# Patient Record
Sex: Male | Born: 1995 | Race: Black or African American | Hispanic: No | Smoking: Former smoker
Health system: Southern US, Community
[De-identification: ages and names within clinical notes are randomized; demographics above are authoritative.]

## PROBLEM LIST (undated history)

## (undated) DIAGNOSIS — S83512A Sprain of anterior cruciate ligament of left knee, initial encounter: Secondary | ICD-10-CM

## (undated) DIAGNOSIS — K810 Acute cholecystitis: Secondary | ICD-10-CM

---

## 2015-10-09 ENCOUNTER — Other Ambulatory Visit: Payer: Self-pay | Admitting: *Deleted

## 2015-10-09 ENCOUNTER — Other Ambulatory Visit: Payer: Self-pay | Admitting: Surgery

## 2015-10-09 ENCOUNTER — Encounter (HOSPITAL_COMMUNITY): Payer: Self-pay

## 2015-10-09 ENCOUNTER — Ambulatory Visit
Admission: RE | Admit: 2015-10-09 | Discharge: 2015-10-09 | Disposition: A | Discharge status: Home / Self Care | Payer: BLUE CROSS/BLUE SHIELD | Source: Ambulatory Visit | Attending: *Deleted | Admitting: *Deleted

## 2015-10-09 ENCOUNTER — Observation Stay (HOSPITAL_COMMUNITY)
Admission: AD | Admit: 2015-10-09 | Discharge: 2015-10-11 | Disposition: A | Discharge status: Home / Self Care | Payer: BLUE CROSS/BLUE SHIELD | Source: Ambulatory Visit | Attending: Surgery | Admitting: Surgery

## 2015-10-09 ENCOUNTER — Ambulatory Visit: Payer: Self-pay | Admitting: Surgery

## 2015-10-09 DIAGNOSIS — K8012 Calculus of gallbladder with acute and chronic cholecystitis without obstruction: Principal | ICD-10-CM | POA: Insufficient documentation

## 2015-10-09 DIAGNOSIS — K810 Acute cholecystitis: Secondary | ICD-10-CM

## 2015-10-09 DIAGNOSIS — Z87891 Personal history of nicotine dependence: Secondary | ICD-10-CM | POA: Diagnosis not present

## 2015-10-09 DIAGNOSIS — R1084 Generalized abdominal pain: Secondary | ICD-10-CM

## 2015-10-09 DIAGNOSIS — R110 Nausea: Secondary | ICD-10-CM

## 2015-10-09 DIAGNOSIS — Z419 Encounter for procedure for purposes other than remedying health state, unspecified: Secondary | ICD-10-CM

## 2015-10-09 DIAGNOSIS — K8000 Calculus of gallbladder with acute cholecystitis without obstruction: Secondary | ICD-10-CM | POA: Diagnosis present

## 2015-10-09 HISTORY — DX: Sprain of anterior cruciate ligament of left knee, initial encounter: S83.512A

## 2015-10-09 HISTORY — DX: Acute cholecystitis: K81.0

## 2015-10-09 LAB — COMPREHENSIVE METABOLIC PANEL
ALBUMIN: 4.4 g/dL (ref 3.5–5.0)
ALT: 12 U/L — ABNORMAL LOW (ref 17–63)
ANION GAP: 12 (ref 5–15)
AST: 25 U/L (ref 15–41)
Alkaline Phosphatase: 56 U/L (ref 38–126)
BUN: 5 mg/dL — ABNORMAL LOW (ref 6–20)
CHLORIDE: 99 mmol/L — AB (ref 101–111)
CO2: 28 mmol/L (ref 22–32)
Calcium: 9.7 mg/dL (ref 8.9–10.3)
Creatinine, Ser: 0.85 mg/dL (ref 0.61–1.24)
GFR calc non Af Amer: >60 mL/min (ref >60)
Glucose, Bld: 111 mg/dL — ABNORMAL HIGH (ref 65–99)
Potassium: 4 mmol/L (ref 3.5–5.1)
SODIUM: 139 mmol/L (ref 135–145)
Total Bilirubin: 0.5 mg/dL (ref 0.3–1.2)
Total Protein: 7.6 g/dL (ref 6.5–8.1)

## 2015-10-09 LAB — CBC
HCT: 39.4 % (ref 39.0–52.0)
Hemoglobin: 13.1 g/dL (ref 13.0–17.0)
MCH: 27.4 pg (ref 26.0–34.0)
MCHC: 33.2 g/dL (ref 30.0–36.0)
MCV: 82.4 fL (ref 78.0–100.0)
PLATELETS: 199 10*3/uL (ref 150–400)
RBC: 4.78 MIL/uL (ref 4.22–5.81)
RDW: 13.2 % (ref 11.5–15.5)
WBC: 19.3 10*3/uL — ABNORMAL HIGH (ref 4.0–10.5)

## 2015-10-09 MED ORDER — DEXTROSE 5 % IV SOLN
2.0000 g | INTRAVENOUS | Status: DC
  Administered 2015-10-09 – 2015-10-10 (×2): 2 g via INTRAVENOUS
  Filled 2015-10-09 (×3): qty 2

## 2015-10-09 MED ORDER — ONDANSETRON 4 MG PO TBDP: 4.0000 mg | ORAL_TABLET | Freq: Four times a day (QID) | ORAL | Status: DC | PRN

## 2015-10-09 MED ORDER — KCL IN DEXTROSE-NACL 20-5-0.45 MEQ/L-%-% IV SOLN: INTRAVENOUS | Status: AC

## 2015-10-09 MED ORDER — IOPAMIDOL (ISOVUE-300) INJECTION 61%: 100.0000 mL | Freq: Once | INTRAVENOUS | Status: DC | PRN

## 2015-10-09 MED ORDER — ACETAMINOPHEN 500 MG PO TABS
1000.0000 mg | ORAL_TABLET | Freq: Four times a day (QID) | ORAL | Status: DC
  Administered 2015-10-09 – 2015-10-11 (×6): 1000 mg via ORAL
  Filled 2015-10-09 (×14): qty 2

## 2015-10-09 MED ORDER — ACETAMINOPHEN 325 MG PO TABS: 650.0000 mg | ORAL_TABLET | Freq: Four times a day (QID) | ORAL | Status: AC | PRN

## 2015-10-09 MED ORDER — ONDANSETRON HCL 4 MG/2ML IJ SOLN
4.0000 mg | Freq: Four times a day (QID) | INTRAMUSCULAR | Status: DC | PRN
Start: 2015-10-09 — End: 2015-10-10

## 2015-10-09 MED ORDER — HYDROMORPHONE HCL 1 MG/ML IJ SOLN
1.0000 mg | INTRAMUSCULAR | Status: DC | PRN
  Administered 2015-10-09 – 2015-10-10 (×5): 1 mg via INTRAVENOUS
  Filled 2015-10-09 (×6): qty 1

## 2015-10-09 MED ORDER — HYDROCODONE-ACETAMINOPHEN 5-325 MG PO TABS: 1.0000 | ORAL_TABLET | ORAL | Status: AC | PRN

## 2015-10-09 MED ORDER — ONDANSETRON HCL 40 MG/20ML IJ SOLN: 4.0000 mg | Freq: Four times a day (QID) | INTRAMUSCULAR | Status: AC | PRN

## 2015-10-09 MED ORDER — ACETAMINOPHEN 650 MG RE SUPP: 650.0000 mg | Freq: Four times a day (QID) | RECTAL | Status: AC | PRN

## 2015-10-09 MED ORDER — DEXTROSE 5 % IV SOLN: 2.0000 g | INTRAVENOUS | Status: AC

## 2015-10-09 MED ORDER — ONDANSETRON 4 MG PO TBDP: 4.0000 mg | ORAL_TABLET | Freq: Four times a day (QID) | ORAL | Status: AC | PRN

## 2015-10-09 MED ORDER — HYDROCODONE-ACETAMINOPHEN 5-325 MG PO TABS: 1.0000 | ORAL_TABLET | ORAL | Status: DC | PRN

## 2015-10-09 MED ORDER — ENOXAPARIN SODIUM 40 MG/0.4ML ~~LOC~~ SOLN
40.0000 mg | SUBCUTANEOUS | Status: DC
  Administered 2015-10-09 – 2015-10-10 (×2): 40 mg via SUBCUTANEOUS
  Filled 2015-10-09 (×3): qty 0.4

## 2015-10-09 MED ORDER — HYDROMORPHONE HCL 2 MG/ML IJ SOLN: 1.0000 mg | INTRAMUSCULAR | Status: AC | PRN

## 2015-10-09 MED ORDER — DEXTROSE-NACL 5-0.9 % IV SOLN
INTRAVENOUS | Status: DC
  Administered 2015-10-09: 14:00:00 via INTRAVENOUS

## 2015-10-09 NOTE — H&P (Signed)
[facility] Admission Note  William Mooney 01/31/1996  [id number].    Requesting MD: Dr. Marchia Bond Chief Complaint/Reason for Consult: Acute cholecystitis  HPI:  20 y/o healthy AA male who was in his usual state of health until he was awoken from sleep early this am with acute severe 8/10 sharp epigastric abdominal pain with nausea.  He has never had anything like this before.  Denies being related to food, although he had broccoli cheese chicken stir-fry last night.  Denies precipating, alleviating or radicular factors.  Only medical hx are ACL/meniscal issues in his left knee.  No H/o hernias or abdominal surgeries.  Denies CP/SOB, fever/chills, vomiting, diarrhea, dysuria. No sick contacts or recent travel.  He lives at home with his wife.    Today he went to his mom's work at [employer] where they have a doctor on staff there.  They suspected cholecystitis so they sent him for a CT scan which showed acute cholecystitis.  Lab work pending.    ROS: All systems reviewed and otherwise negative except for as above  History reviewed. No pertinent family history.  History reviewed. No pertinent past medical history.  History reviewed. No pertinent past surgical history.  Social History:  reports that he has quit smoking. His smoking use included Cigarettes. He has never used smokeless tobacco. He reports that he drinks about 1.2 oz of alcohol per week. He reports that he uses illicit drugs (Marijuana).  Allergies: Allergies not on file  No prescriptions prior to admission    Blood pressure 131/70, pulse 57, temperature 97.8 F (36.6 C), resp. rate 20, SpO2 100 %. Physical Exam: General: pleasant, WD/WN AA male who is laying in bed in NAD HEENT: head is normocephalic, atraumatic.  Sclera are noninjected.  PERRL.  Ears and nose without any masses or lesions.  Mouth is pink and moist Heart: regular, rate, and rhythm.  No obvious murmurs, gallops, or rubs noted.   Palpable pedal pulses bilaterally Lungs: CTAB, no wheezes, rhonchi, or rales noted.  Respiratory effort nonlabored Abd: soft, ND, tender in the upper abdomen and >epigastrium, +BS, no masses, hernias, or organomegaly, no scars MS: all 4 extremities are symmetrical with no cyanosis, clubbing, or edema. Skin: warm and dry with no masses, lesions, or rashes Psych: A&Ox3 with an appropriate affect.   No results found for this or any previous visit (from the past 48 hour(s)). Ct Abdomen W Contrast  10/09/2015  CLINICAL DATA:  Mid upper and epigastric pain with nausea. Acute onset this morning. EXAM: CT ABDOMEN WITH CONTRAST TECHNIQUE: Multidetector CT imaging of the abdomen was performed using the standard protocol following bolus administration of intravenous contrast. CONTRAST:  100 cc Isovue 300. COMPARISON:  None. FINDINGS: Lower chest: Lung bases show no acute findings. Heart size normal. No pericardial or pleural effusion. Hepatobiliary: A 1.8 cm low-attenuation lesion appears to arise from the right hepatic lobe, superior to the gallbladder fossa (series 2, image 27), likely representing a cyst. There is hepatic parenchymal hyper attenuation along the gallbladder fossa. Gallbladder wall appears edematous and there is at least 1 gallstone. Measurement is difficult as it is not well calcified. No biliary ductal dilatation. Pancreas: Negative. Spleen: Negative. Adrenals/Urinary Tract: Adrenal glands and kidneys are unremarkable. Visualized portions of the ureters are decompressed. Stomach/Bowel: Stomach and visualized portions of the small bowel and colon are unremarkable. Vascular/Lymphatic: Vascular structures are unremarkable. No pathologically enlarged lymph nodes. Other: Mesenteries and peritoneum are unremarkable.  No free fluid. Musculoskeletal: No  worrisome lytic or sclerotic lesions. IMPRESSION: 1. 2. Cholelithiasis with gallbladder wall edema and apparent hyperemia in the surrounding hepatic  parenchyma. Findings are suggestive of acute cholecystitis. Right upper quadrant ultrasound may be helpful in further evaluation, as clinically indicated. 3. Probable small hepatic cyst along the superior margin of the gallbladder fossa. Electronically Signed   By: Leanna Battles M.D.   On: 10/09/2015 10:33     Assessment/Plan Acute cholecystitis -Admit to [facility], plan for IV antibiotics (Rocephin) and OR tomorrow for lap chole with possible IOC with Dr. Gerrit Friends -NPO, bowel rest, IVF, pain control, antiemetics -SCD's, lovenox @ 1800, but hold pre-op -Ambulate and IS -Pending labs -Discussed surgical risks/benefits and post-op recovery   Nonie Hoyer, [facility] 10/09/2015, 1:33 PM Pager: [phone]

## 2015-10-10 ENCOUNTER — Encounter (HOSPITAL_COMMUNITY): Admission: AD | Disposition: A | Discharge status: Home / Self Care | Payer: Self-pay | Source: Ambulatory Visit

## 2015-10-10 ENCOUNTER — Observation Stay (HOSPITAL_COMMUNITY): Payer: BLUE CROSS/BLUE SHIELD

## 2015-10-10 ENCOUNTER — Encounter (HOSPITAL_COMMUNITY): Payer: Self-pay

## 2015-10-10 ENCOUNTER — Observation Stay (HOSPITAL_COMMUNITY): Payer: BLUE CROSS/BLUE SHIELD | Admitting: Anesthesiology

## 2015-10-10 DIAGNOSIS — K8012 Calculus of gallbladder with acute and chronic cholecystitis without obstruction: Secondary | ICD-10-CM | POA: Diagnosis not present

## 2015-10-10 HISTORY — PX: CHOLECYSTECTOMY: SHX55

## 2015-10-10 LAB — SURGICAL PCR SCREEN
MRSA, PCR: NEGATIVE
Staphylococcus aureus: NEGATIVE

## 2015-10-10 SURGERY — LAPAROSCOPIC CHOLECYSTECTOMY WITH INTRAOPERATIVE CHOLANGIOGRAM
Anesthesia: General

## 2015-10-10 MED ORDER — HYDROMORPHONE HCL 1 MG/ML IJ SOLN
0.2500 mg | INTRAMUSCULAR | Status: DC | PRN
  Administered 2015-10-10 (×2): 0.5 mg via INTRAVENOUS

## 2015-10-10 MED ORDER — FENTANYL CITRATE (PF) 250 MCG/5ML IJ SOLN
INTRAMUSCULAR | Status: AC
  Filled 2015-10-10: qty 5

## 2015-10-10 MED ORDER — SUGAMMADEX SODIUM 200 MG/2ML IV SOLN
INTRAVENOUS | Status: DC | PRN
  Administered 2015-10-10: 200 mg via INTRAVENOUS

## 2015-10-10 MED ORDER — DEXTROSE 5 % IV SOLN
INTRAVENOUS | Status: AC
  Filled 2015-10-10: qty 2

## 2015-10-10 MED ORDER — LACTATED RINGERS IV SOLN
INTRAVENOUS | Status: DC | PRN
  Administered 2015-10-10 (×2): via INTRAVENOUS

## 2015-10-10 MED ORDER — BUPIVACAINE-EPINEPHRINE (PF) 0.25% -1:200000 IJ SOLN
INTRAMUSCULAR | Status: AC
  Filled 2015-10-10: qty 30

## 2015-10-10 MED ORDER — ROCURONIUM BROMIDE 100 MG/10ML IV SOLN
INTRAVENOUS | Status: AC
  Filled 2015-10-10: qty 1

## 2015-10-10 MED ORDER — LACTATED RINGERS IV SOLN: INTRAVENOUS | Status: DC

## 2015-10-10 MED ORDER — FENTANYL CITRATE (PF) 100 MCG/2ML IJ SOLN
INTRAMUSCULAR | Status: DC | PRN
  Administered 2015-10-10: 125 ug via INTRAVENOUS

## 2015-10-10 MED ORDER — ACETAMINOPHEN 650 MG RE SUPP: 650.0000 mg | Freq: Four times a day (QID) | RECTAL | Status: DC | PRN

## 2015-10-10 MED ORDER — LIDOCAINE HCL (CARDIAC) 20 MG/ML IV SOLN
INTRAVENOUS | Status: DC | PRN
  Administered 2015-10-10: 100 mg via INTRAVENOUS

## 2015-10-10 MED ORDER — EPHEDRINE SULFATE 50 MG/ML IJ SOLN
INTRAMUSCULAR | Status: DC | PRN
  Administered 2015-10-10: 5 mg via INTRAVENOUS

## 2015-10-10 MED ORDER — HYDROCODONE-ACETAMINOPHEN 5-325 MG PO TABS: 1.0000 | ORAL_TABLET | ORAL | Status: DC | PRN

## 2015-10-10 MED ORDER — HYDROMORPHONE HCL 1 MG/ML IJ SOLN
INTRAMUSCULAR | Status: AC
Start: 2015-10-10 — End: 2015-10-11
  Filled 2015-10-10: qty 1

## 2015-10-10 MED ORDER — LIDOCAINE HCL (CARDIAC) 20 MG/ML IV SOLN
INTRAVENOUS | Status: AC
  Filled 2015-10-10: qty 5

## 2015-10-10 MED ORDER — ONDANSETRON 4 MG PO TBDP: 4.0000 mg | ORAL_TABLET | Freq: Four times a day (QID) | ORAL | Status: DC | PRN

## 2015-10-10 MED ORDER — PROPOFOL 10 MG/ML IV BOLUS
INTRAVENOUS | Status: AC
  Filled 2015-10-10: qty 20

## 2015-10-10 MED ORDER — ROCURONIUM BROMIDE 100 MG/10ML IV SOLN
INTRAVENOUS | Status: DC | PRN
  Administered 2015-10-10: 10 mg via INTRAVENOUS
  Administered 2015-10-10: 50 mg via INTRAVENOUS

## 2015-10-10 MED ORDER — HYDROMORPHONE HCL 1 MG/ML IJ SOLN
0.5000 mg | INTRAMUSCULAR | Status: DC | PRN
  Administered 2015-10-10 – 2015-10-11 (×3): 1 mg via INTRAVENOUS
  Filled 2015-10-10 (×3): qty 1

## 2015-10-10 MED ORDER — DEXTROSE 5 % IV SOLN: 2.0000 g | INTRAVENOUS | Status: DC

## 2015-10-10 MED ORDER — HYDROMORPHONE HCL 1 MG/ML IJ SOLN
INTRAMUSCULAR | Status: AC
  Filled 2015-10-10: qty 1

## 2015-10-10 MED ORDER — BUPIVACAINE-EPINEPHRINE 0.5% -1:200000 IJ SOLN
INTRAMUSCULAR | Status: DC | PRN
  Administered 2015-10-10: 30 mL

## 2015-10-10 MED ORDER — LACTATED RINGERS IV SOLN
INTRAVENOUS | Status: DC
  Administered 2015-10-10: 1000 mL via INTRAVENOUS

## 2015-10-10 MED ORDER — KCL IN DEXTROSE-NACL 20-5-0.45 MEQ/L-%-% IV SOLN
INTRAVENOUS | Status: DC
  Administered 2015-10-10: 17:00:00 via INTRAVENOUS
  Filled 2015-10-10 (×3): qty 1000

## 2015-10-10 MED ORDER — PROPOFOL 10 MG/ML IV BOLUS
INTRAVENOUS | Status: DC | PRN
  Administered 2015-10-10: 200 mg via INTRAVENOUS

## 2015-10-10 MED ORDER — HEMOSTATIC AGENTS (NO CHARGE) OPTIME
TOPICAL | Status: DC | PRN
  Administered 2015-10-10: 1 via TOPICAL

## 2015-10-10 MED ORDER — SUGAMMADEX SODIUM 200 MG/2ML IV SOLN
INTRAVENOUS | Status: AC
  Filled 2015-10-10: qty 2

## 2015-10-10 MED ORDER — 0.9 % SODIUM CHLORIDE (POUR BTL) OPTIME
TOPICAL | Status: DC | PRN
  Administered 2015-10-10: 1000 mL

## 2015-10-10 MED ORDER — ONDANSETRON HCL 4 MG/2ML IJ SOLN
INTRAMUSCULAR | Status: DC | PRN
  Administered 2015-10-10: 4 mg via INTRAVENOUS

## 2015-10-10 MED ORDER — LACTATED RINGERS IR SOLN
Status: DC | PRN
Start: 2015-10-10 — End: 2015-10-10
  Administered 2015-10-10: 1000 mL

## 2015-10-10 MED ORDER — MIDAZOLAM HCL 2 MG/2ML IJ SOLN
INTRAMUSCULAR | Status: AC
  Filled 2015-10-10: qty 2

## 2015-10-10 MED ORDER — MIDAZOLAM HCL 5 MG/5ML IJ SOLN
INTRAMUSCULAR | Status: DC | PRN
  Administered 2015-10-10: 2 mg via INTRAVENOUS

## 2015-10-10 MED ORDER — IOHEXOL 300 MG/ML  SOLN
INTRAMUSCULAR | Status: DC | PRN
  Administered 2015-10-10: 7.5 mL

## 2015-10-10 MED ORDER — ACETAMINOPHEN 325 MG PO TABS: 650.0000 mg | ORAL_TABLET | Freq: Four times a day (QID) | ORAL | Status: DC | PRN

## 2015-10-10 MED ORDER — ONDANSETRON HCL 4 MG/2ML IJ SOLN: 4.0000 mg | Freq: Four times a day (QID) | INTRAMUSCULAR | Status: DC | PRN

## 2015-10-10 MED ORDER — ONDANSETRON HCL 4 MG/2ML IJ SOLN
INTRAMUSCULAR | Status: AC
  Filled 2015-10-10: qty 2

## 2015-10-10 SURGICAL SUPPLY — 46 items
APL SKNCLS STERI-STRIP NONHPOA (GAUZE/BANDAGES/DRESSINGS) ×1
APL SRG 38 LTWT LNG FL B (MISCELLANEOUS) ×1
APPLICATOR ARISTA FLEXITIP XL (MISCELLANEOUS) ×2 IMPLANT
APPLIER CLIP ROT 10 11.4 M/L (STAPLE) ×3
APR CLP MED LRG 11.4X10 (STAPLE) ×1
BAG SPEC RTRVL LRG 6X4 10 (ENDOMECHANICALS) ×1
BENZOIN TINCTURE PRP APPL 2/3 (GAUZE/BANDAGES/DRESSINGS) ×3 IMPLANT
CABLE HIGH FREQUENCY MONO STRZ (ELECTRODE) ×3 IMPLANT
CHLORAPREP W/TINT 26ML (MISCELLANEOUS) ×3 IMPLANT
CLIP APPLIE ROT 10 11.4 M/L (STAPLE) ×1 IMPLANT
CLOSURE STERI-STRIP 1/4X4 (GAUZE/BANDAGES/DRESSINGS) ×2 IMPLANT
COVER MAYO STAND STRL (DRAPES) ×3 IMPLANT
COVER SURGICAL LIGHT HANDLE (MISCELLANEOUS) ×3 IMPLANT
DECANTER SPIKE VIAL GLASS SM (MISCELLANEOUS) ×3 IMPLANT
DRAPE C-ARM 42X120 X-RAY (DRAPES) ×3 IMPLANT
DRAPE LAPAROSCOPIC ABDOMINAL (DRAPES) ×1 IMPLANT
DRSG TEGADERM 2-3/8X2-3/4 SM (GAUZE/BANDAGES/DRESSINGS) ×2 IMPLANT
ELECT REM PT RETURN 9FT ADLT (ELECTROSURGICAL) ×3
ELECTRODE REM PT RTRN 9FT ADLT (ELECTROSURGICAL) ×1 IMPLANT
GAUZE SPONGE 2X2 8PLY STRL LF (GAUZE/BANDAGES/DRESSINGS) ×1 IMPLANT
GLOVE BIO SURGEON STRL SZ 6.5 (GLOVE) ×1 IMPLANT
GLOVE BIO SURGEONS STRL SZ 6.5 (GLOVE) ×1
GLOVE BIOGEL PI IND STRL 7.5 (GLOVE) IMPLANT
GLOVE BIOGEL PI INDICATOR 7.5 (GLOVE) ×2
GLOVE INDICATOR 6.5 STRL GRN (GLOVE) ×2 IMPLANT
GLOVE SURG ORTHO 8.0 STRL STRW (GLOVE) ×3 IMPLANT
GLOVE SURG SS PI 7.5 STRL IVOR (GLOVE) ×2 IMPLANT
GOWN STRL REUS W/ TWL LRG LVL3 (GOWN DISPOSABLE) IMPLANT
GOWN STRL REUS W/TWL LRG LVL3 (GOWN DISPOSABLE) ×3
GOWN STRL REUS W/TWL XL LVL3 (GOWN DISPOSABLE) ×6 IMPLANT
HEMOSTAT ARISTA ABSORB 3G PWDR (MISCELLANEOUS) ×2 IMPLANT
KIT BASIN OR (CUSTOM PROCEDURE TRAY) ×3 IMPLANT
POUCH SPECIMEN RETRIEVAL 10MM (ENDOMECHANICALS) ×3 IMPLANT
SCISSORS LAP 5X35 DISP (ENDOMECHANICALS) ×3 IMPLANT
SET CHOLANGIOGRAPH MIX (MISCELLANEOUS) ×5 IMPLANT
SET IRRIG TUBING LAPAROSCOPIC (IRRIGATION / IRRIGATOR) ×3 IMPLANT
SLEEVE XCEL OPT CAN 5 100 (ENDOMECHANICALS) ×3 IMPLANT
SPONGE GAUZE 2X2 STER 10/PKG (GAUZE/BANDAGES/DRESSINGS) ×2
SUT MNCRL AB 4-0 PS2 18 (SUTURE) ×3 IMPLANT
SUT VICRYL 0 UR6 27IN ABS (SUTURE) ×2 IMPLANT
TAPE CLOTH SURG 4X10 WHT LF (GAUZE/BANDAGES/DRESSINGS) ×2 IMPLANT
TOWEL OR 17X26 10 PK STRL BLUE (TOWEL DISPOSABLE) ×3 IMPLANT
TRAY LAPAROSCOPIC (CUSTOM PROCEDURE TRAY) ×3 IMPLANT
TROCAR BLADELESS OPT 5 100 (ENDOMECHANICALS) ×3 IMPLANT
TROCAR XCEL BLUNT TIP 100MML (ENDOMECHANICALS) ×3 IMPLANT
TROCAR XCEL NON-BLD 11X100MML (ENDOMECHANICALS) ×3 IMPLANT

## 2015-10-10 NOTE — Transfer of Care (Signed)
Immediate Anesthesia Transfer of Care Note  Patient: William Mooney  Procedure(s) Performed: Procedure(s): LAPAROSCOPIC CHOLECYSTECTOMY WITH INTRAOPERATIVE CHOLANGIOGRAM (N/A)  Patient Location: PACU  Anesthesia Type:General  Level of Consciousness: awake, alert  and oriented  Airway & Oxygen Therapy: Patient Spontanous Breathing and Patient connected to face mask oxygen  Post-op Assessment: Report given to RN and Post -op Vital signs reviewed and stable  Post vital signs: Reviewed and stable  Last Vitals:  Filed Vitals:   10/10/15 0112 10/10/15 0439  BP: 116/50 95/44  Pulse: 53 50  Temp: 36.9 C 36.9 C  Resp: 18 16    Complications: No apparent anesthesia complications

## 2015-10-10 NOTE — Anesthesia Postprocedure Evaluation (Signed)
Anesthesia Post Note  Patient: William Mooney  Procedure(s) Performed: Procedure(s) (LRB): LAPAROSCOPIC CHOLECYSTECTOMY WITH INTRAOPERATIVE CHOLANGIOGRAM (N/A)  Patient location during evaluation: PACU Anesthesia Type: General Level of consciousness: awake and alert Pain management: pain level controlled Vital Signs Assessment: post-procedure vital signs reviewed and stable Respiratory status: spontaneous breathing, nonlabored ventilation, respiratory function stable and patient connected to nasal cannula oxygen Cardiovascular status: blood pressure returned to baseline and stable Postop Assessment: no signs of nausea or vomiting Anesthetic complications: no    Last Vitals:  Filed Vitals:   10/10/15 1600 10/10/15 1613  BP: 121/58 122/53  Pulse: 65 60  Temp: 36.9 C 37.1 C  Resp: 16 16    Last Pain:  Filed Vitals:   10/10/15 1747  PainSc: 0-No pain                 Shaney Deckman L

## 2015-10-10 NOTE — Anesthesia Preprocedure Evaluation (Addendum)
Anesthesia Evaluation  Patient identified by MRN, date of birth, ID band Patient awake    Reviewed: Allergy & Precautions, H&P , NPO status , Patient's Chart, lab work & pertinent test results  Airway Mallampati: II  TM Distance: >3 FB Neck ROM: full    Dental no notable dental hx. (+) Dental Advisory Given, Teeth Intact   Pulmonary neg pulmonary ROS, former smoker,    Pulmonary exam normal breath sounds clear to auscultation       Cardiovascular Exercise Tolerance: Good negative cardio ROS Normal cardiovascular exam Rhythm:regular Rate:Normal     Neuro/Psych negative neurological ROS  negative psych ROS   GI/Hepatic negative GI ROS, Neg liver ROS,   Endo/Other  negative endocrine ROS  Renal/GU negative Renal ROS  negative genitourinary   Musculoskeletal   Abdominal   Peds  Hematology negative hematology ROS (+)   Anesthesia Other Findings   Reproductive/Obstetrics negative OB ROS                            Anesthesia Physical Anesthesia Plan  ASA: I  Anesthesia Plan: General   Post-op Pain Management:    Induction: Intravenous  Airway Management Planned: Oral ETT  Additional Equipment:   Intra-op Plan:   Post-operative Plan: Extubation in OR  Informed Consent: I have reviewed the patients History and Physical, chart, labs and discussed the procedure including the risks, benefits and alternatives for the proposed anesthesia with the patient or authorized representative who has indicated his/her understanding and acceptance.   Dental Advisory Given  Plan Discussed with: CRNA and Surgeon  Anesthesia Plan Comments:        Anesthesia Quick Evaluation  

## 2015-10-10 NOTE — Anesthesia Procedure Notes (Signed)
Procedure Name: Intubation Date/Time: 10/10/2015 1:16 PM Performed by: Thornell Mule Pre-anesthesia Checklist: Patient identified, Emergency Drugs available, Suction available and Patient being monitored Patient Re-evaluated:Patient Re-evaluated prior to inductionOxygen Delivery Method: Circle System Utilized Preoxygenation: Pre-oxygenation with 100% oxygen Intubation Type: IV induction Ventilation: Mask ventilation without difficulty Laryngoscope Size: Miller and 3 Grade View: Grade I Tube type: Oral Tube size: 7.5 mm Number of attempts: 1 Airway Equipment and Method: Stylet and Oral airway Placement Confirmation: ETT inserted through vocal cords under direct vision,  positive ETCO2 and breath sounds checked- equal and bilateral Secured at: 22 cm Tube secured with: Tape Dental Injury: Teeth and Oropharynx as per pre-operative assessment

## 2015-10-10 NOTE — Op Note (Signed)
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  For cholecystectomy.  Procedure Details:  The patient was seen in the pre-op holding area. The risks, benefits, complications, treatment options, and expected outcomes were previously discussed with the patient. The patient agreed with the proposed plan and has signed the informed consent form.  The patient was brought to the Operating Room, identified as William Mooney and the procedure verified as laparoscopic cholecystectomy with intraoperative cholangiography. A "time out" was completed and the above information confirmed.  The patient was placed in the supine position. Following induction of general anesthesia, the abdomen was prepped and draped in the usual aseptic fashion.  An incision was made in the skin near the umbilicus. The midline fascia was incised and the peritoneal cavity was entered and a Hasson canula was introduced under direct vision.  The Hasson canula was secured with a 0-Vicryl pursestring suture. Pneumoperitoneum was established with carbon dioxide. Additional trocars were introduced under direct vision along the right costal margin in the midline, mid-clavicular line, and anterior axillary line.   The gallbladder was identified.  It was acutely inflamed, thick-walled, and edematous.  The gallbladder was aspirated with a trocar and the fundus grasped and retracted cephalad. Adhesions were taken down bluntly and the electrocautery was utilized as needed, taking care not to injure any  adjacent structures. The infundibulum was grasped and retracted laterally, exposing the peritoneum overlying the triangle of Calot. The peritoneum was incised and structures exposed with blunt dissection. The cystic duct was clearly identified, bluntly dissected circumferentially, and clipped at the neck of the gallbladder.  An incision was made in the cystic duct and the cholangiogram catheter introduced. The catheter was secured using an ligaclip.  Real-time cholangiography was performed using C-arm fluoroscopy.  There was rapid filling of a normal caliber common bile duct.  There was reflux of contrast into the common hepatic duct.  There was a proximal choledochal cyst at the bifurcation.  Contrast entered the left and right hepatic ductal systems.  There was free flow distally into the duodenum without filling defect or obstruction.  The catheter was removed from the peritoneal cavity.  The cystic duct was then ligated with surgical clips and divided. The cystic artery was identified, dissected circumferentially, ligated with ligaclips, and divided.  The gallbladder was dissected away from the liver bed using the electrocautery for hemostasis. The gallbladder was completely removed from the liver and placed into an endocatch bag. The gallbladder was removed in the endocatch bag through the umbilical port site and submitted to pathology for review.  The right upper quadrant was irrigated and the gallbladder bed was inspected. Hemostasis was achieved with the electrocautery.  Arista was applied to the gallbladder bed.  Pneumoperitoneum was released after viewing removal of the trocars with good hemostasis noted. The umbilical wound was irrigated and the fascia was then closed with the pursestring suture.  Local anesthetic was infiltrated at all port sites. The skin incisions were closed with 4-0 Monocril subcuticular sutures and steri-strips and dressings were applied.  Instrument, sponge, and needle  counts  were correct at the conclusion of the case.  The patient was awakened from anesthesia and brought to the recovery room in stable condition.  The patient tolerated the procedure well.   Velora Heckler, MD, [facility] Office: [phone]

## 2015-10-11 DIAGNOSIS — K8012 Calculus of gallbladder with acute and chronic cholecystitis without obstruction: Secondary | ICD-10-CM | POA: Diagnosis not present

## 2015-10-11 MED ORDER — ACETAMINOPHEN 500 MG PO TABS: 1000.0000 mg | ORAL_TABLET | Freq: Four times a day (QID) | ORAL | Status: AC

## 2015-10-11 MED ORDER — IBUPROFEN 600 MG PO TABS: 600.0000 mg | ORAL_TABLET | Freq: Four times a day (QID) | ORAL | Status: AC | PRN

## 2015-10-11 MED ORDER — HYDROCODONE-ACETAMINOPHEN 5-325 MG PO TABS: 1.0000 | ORAL_TABLET | ORAL | Status: AC | PRN

## 2015-10-11 NOTE — Progress Notes (Signed)
Pt encouraged 2x this AM to ambulate in the hall and order breakfast to see if can tolerate.  Pt not completed at this time despite education on importance.

## 2015-10-11 NOTE — Discharge Instructions (Signed)

## 2015-10-11 NOTE — Progress Notes (Signed)
Pt discharged home.  IV d/c with cath intact. VSS.  Prescriptions and discharge instructions provided. Pt verbalized understanding.

## 2015-10-11 NOTE — Discharge Summary (Signed)
Physician Discharge Summary  Patient ID: William Mooney MRN: [id number] DOB/AGE: 03-02-1996 20 y.o.  Admit date: 10/09/2015 Discharge date: 10/11/2015  Admitting Diagnosis: Acute cholecystitis  Discharge Diagnosis Patient Active Problem List   Diagnosis Date Noted  . Cholecystitis, acute 10/09/2015  . Acute cholecystitis 10/09/2015  choledochal cyst  Consultants none  Imaging: Dg Cholangiogram Operative  10/10/2015  CLINICAL DATA:  Cholecystectomy. Choledochal cyst at the bifurcation of the right and left hepatic ducts. EXAM: INTRAOPERATIVE CHOLANGIOGRAM TECHNIQUE: Cholangiographic images from the C-arm fluoroscopic device were submitted for interpretation post-operatively. Please see the procedural report for the amount of contrast and the fluoroscopy time utilized. COMPARISON:  Abdomen CT dated 10/09/2015. FINDINGS: Catheters injection of contrast into the cystic duct remnant, filling a normal caliber common duct and intrahepatic ducts. At the level of the common hepatic duct, a rounded collection of contrast is demonstrated. On later images, there are multiple tiny, rounded filling defects in the dependent portion of that structure. IMPRESSION: 1. 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 2. No biliary obstruction. Electronically Signed   By: Beckie Salts M.D.   On: 10/10/2015 14:17   Ct Abdomen W Contrast  10/09/2015  CLINICAL DATA:  Mid upper and epigastric pain with nausea. Acute onset this morning. EXAM: CT ABDOMEN WITH CONTRAST TECHNIQUE: Multidetector CT imaging of the abdomen was performed using the standard protocol following bolus administration of intravenous contrast. CONTRAST:  100 cc Isovue 300. COMPARISON:  None. FINDINGS: Lower chest: Lung bases show no acute findings. Heart size normal. No pericardial or pleural effusion. Hepatobiliary: A 1.8 cm low-attenuation lesion appears to arise from the right hepatic lobe, superior to the gallbladder fossa  (series 2, image 27), likely representing a cyst. There is hepatic parenchymal hyper attenuation along the gallbladder fossa. Gallbladder wall appears edematous and there is at least 1 gallstone. Measurement is difficult as it is not well calcified. No biliary ductal dilatation. Pancreas: Negative. Spleen: Negative. Adrenals/Urinary Tract: Adrenal glands and kidneys are unremarkable. Visualized portions of the ureters are decompressed. Stomach/Bowel: Stomach and visualized portions of the small bowel and colon are unremarkable. Vascular/Lymphatic: Vascular structures are unremarkable. No pathologically enlarged lymph nodes. Other: Mesenteries and peritoneum are unremarkable.  No free fluid. Musculoskeletal: No worrisome lytic or sclerotic lesions. IMPRESSION: 1. 2. Cholelithiasis with gallbladder wall edema and apparent hyperemia in the surrounding hepatic parenchyma. Findings are suggestive of acute cholecystitis. Right upper quadrant ultrasound may be helpful in further evaluation, as clinically indicated. 3. Probable small hepatic cyst along the superior margin of the gallbladder fossa. Electronically Signed   By: Leanna Battles M.D.   On: 10/09/2015 10:33    Procedures Laparoscopic cholecystectomy with IOC---Dr. Laurin Coder Course:  William Mooney is a healthy 20 year old male who presented to [facility] with acute cholecystitis seen on CT at outpatient PCPs office.  Patient was admitted and underwent procedure listed above.  IOC revealed a choledochal cyst probably containing stones. Tolerated procedure well and was transferred to the floor.  Diet was advanced as tolerated.  On POD#1, the patient was voiding well, tolerating diet, ambulating well, pain well controlled, vital signs stable, incisions c/d/i and felt stable for discharge home.  Medication risks, benefits and therapeutic alternatives were reviewed with the patient.  She/he verbalizes understanding.  Patient will follow up in our office in 2  weeks and knows to call with questions or concerns.  Our office will work on getting referral to tertiary care facility regarding choledochal cyst.  This was discussed  in detail with the patient and he verbalizes understanding.   Physical Exam: General:  Alert, NAD, pleasant, comfortable Abd:  Soft, ND, mild tenderness, incisions C/D/I    Medication List    TAKE these medications        acetaminophen 500 MG tablet  Commonly known as:  TYLENOL  Take 2 tablets (1,000 mg total) by mouth every 6 (six) hours.     HYDROcodone-acetaminophen 5-325 MG tablet  Commonly known as:  NORCO/VICODIN  Take 1-2 tablets by mouth every 4 (four) hours as needed for moderate pain.     ibuprofen 600 MG tablet  Commonly known as:  ADVIL,MOTRIN  Take 1 tablet (600 mg total) by mouth every 6 (six) hours as needed.             Follow-up Information    Follow up with Velora Heckler, MD. Schedule an appointment as soon as possible for a visit in 2 weeks.   Specialty:  General Surgery   Why:  post operative check up    Contact information:   [address] [place] [address] [phone]       Signed: Ashok Norris, [facility] [phone]  10/11/2015, 10:08 AM

## 2017-03-16 IMAGING — CT CT ABDOMEN W/ CM
1 of 2 series · 15 of 32 positions shown, 19 images · IV contrast (APPLIED)
Comparison: None.

CLINICAL DATA: Mid upper and epigastric pain with nausea. Acute
onset this morning.

EXAM:
CT ABDOMEN WITH CONTRAST
TECHNIQUE: Multidetector CT imaging of the abdomen was performed using the
standard protocol following bolus administration of intravenous
contrast.
CONTRAST:  100 cc Isovue 300.

[Series 2: abdroutine 5.0 i31s 1 · axial · 0.70mm/px · z∈[-306,-30]mm · 15 of 61 slices shown, 19 images]
[im 3/61  soft-tissue]
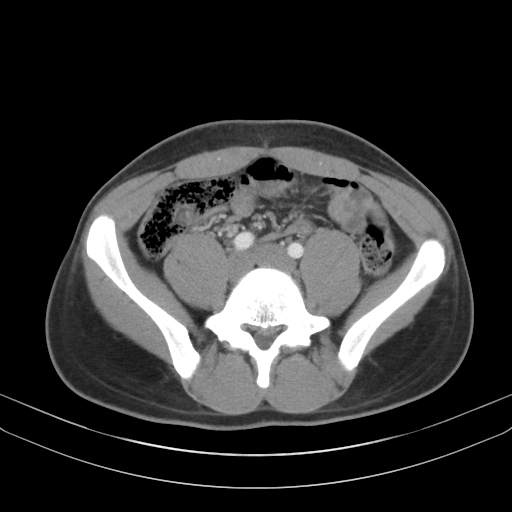
[im 3/61  bone]
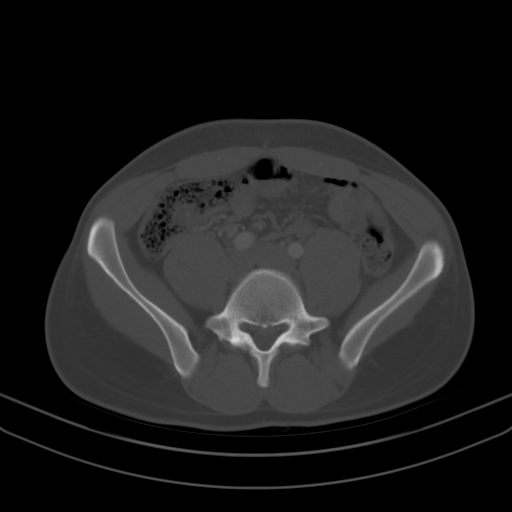
[im 8/61  soft-tissue]
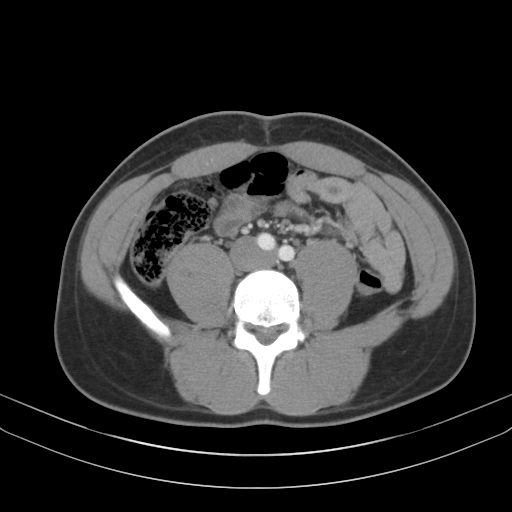
[im 14/61  soft-tissue]
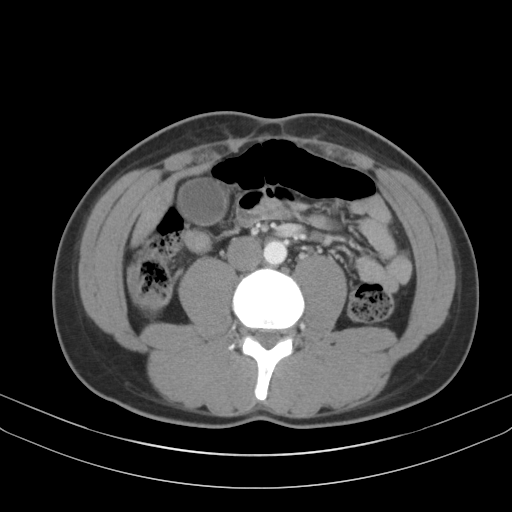
[im 16/61  soft-tissue]
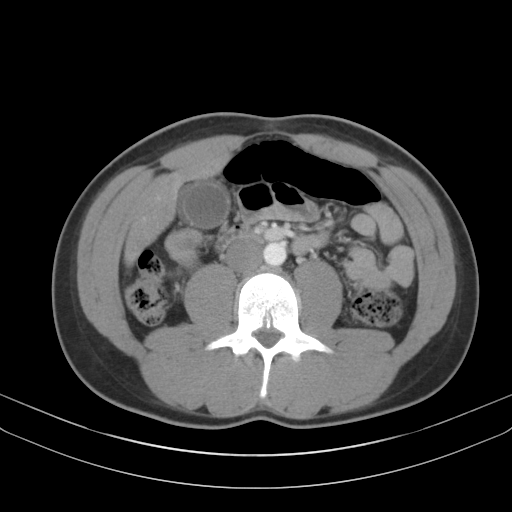
[im 21/61  soft-tissue]
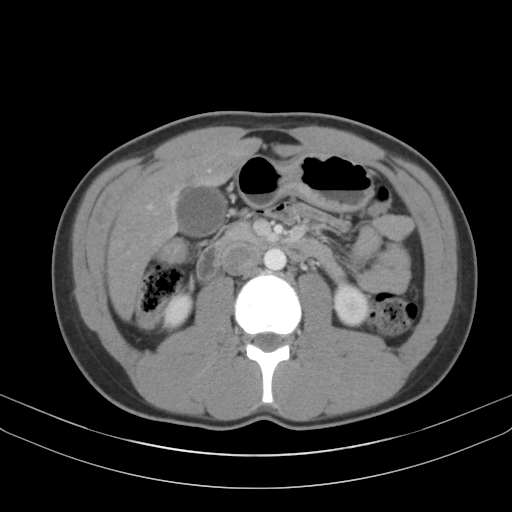
[im 27/61  soft-tissue]
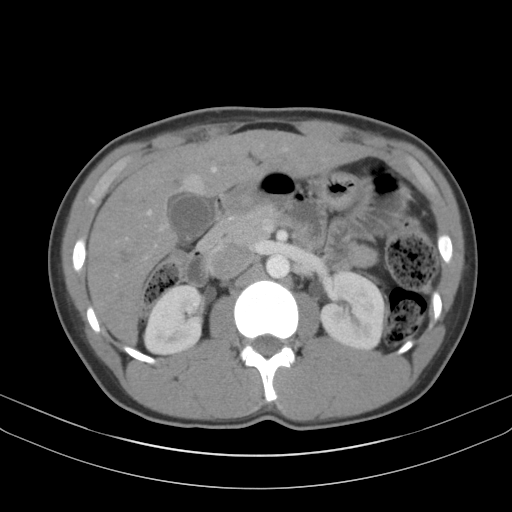
[im 32/61  soft-tissue]
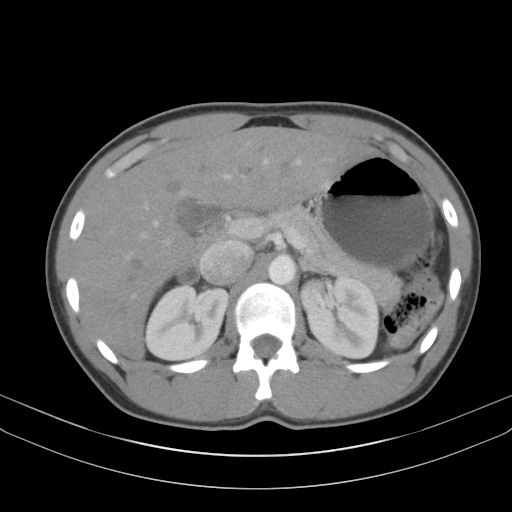
[im 34/61  soft-tissue]
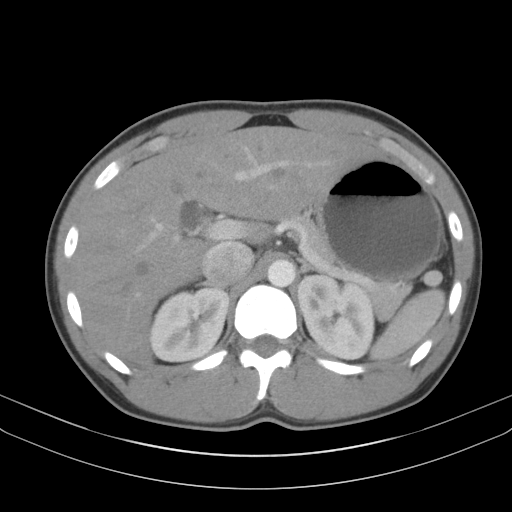
[im 40/61  soft-tissue]
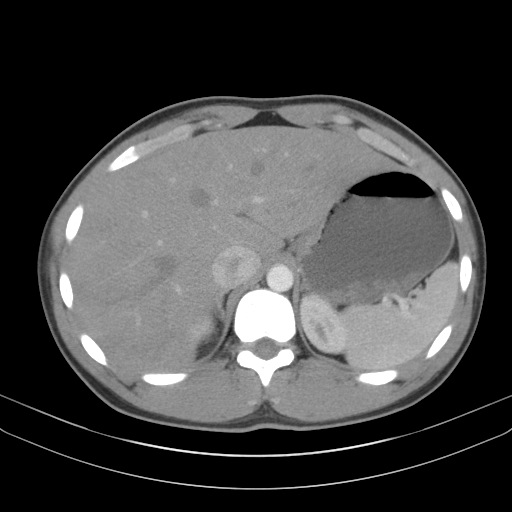
[im 40/61  bone]
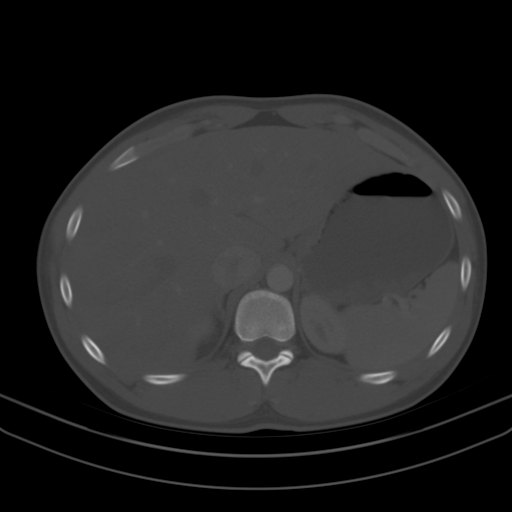
[im 45/61  soft-tissue]
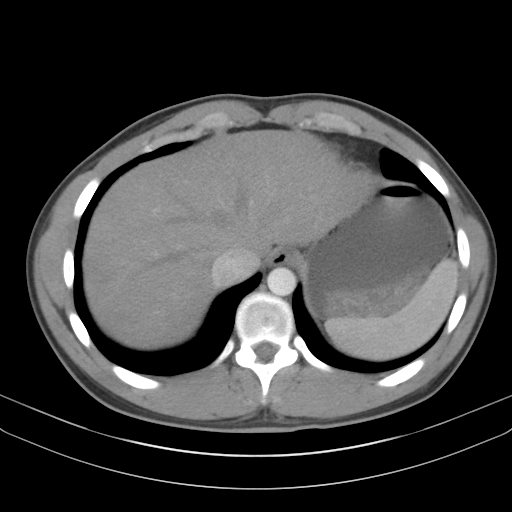
[im 47/61  soft-tissue]
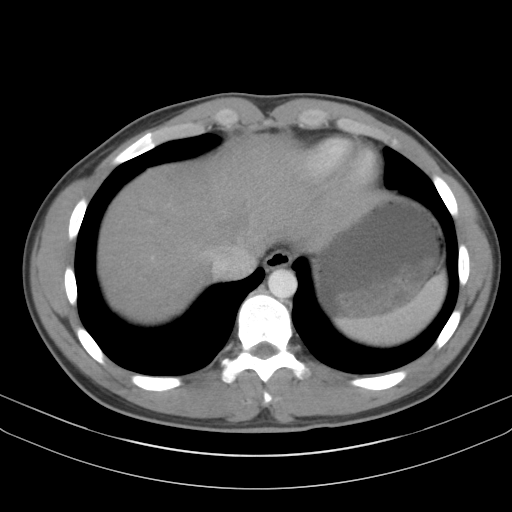
[im 50/61  lung]
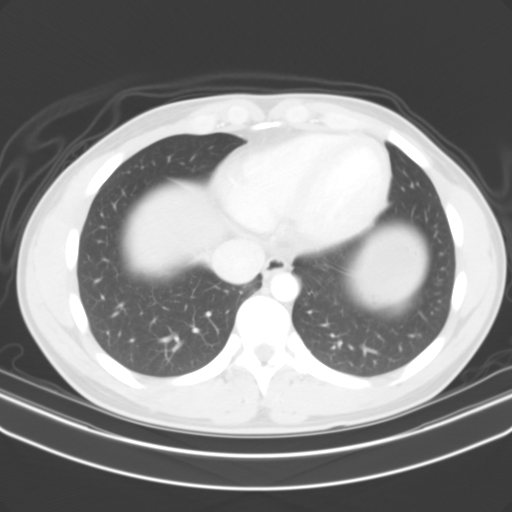
[im 53/61  soft-tissue]
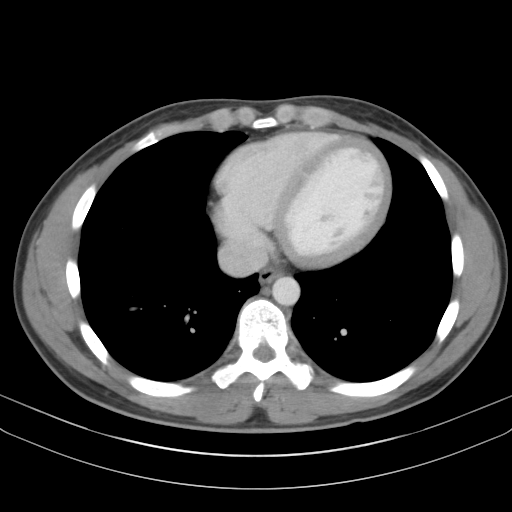
[im 53/61  lung]
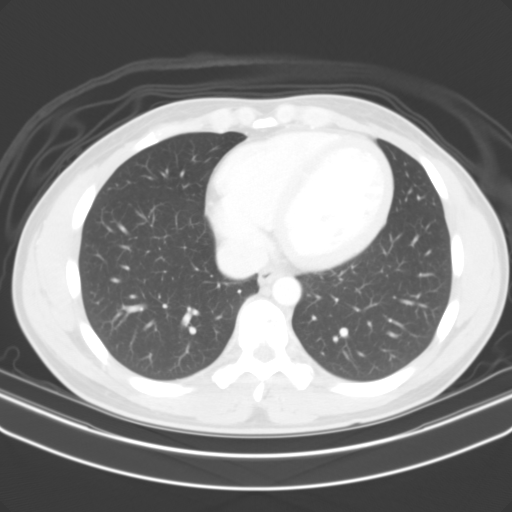
[im 55/61  lung]
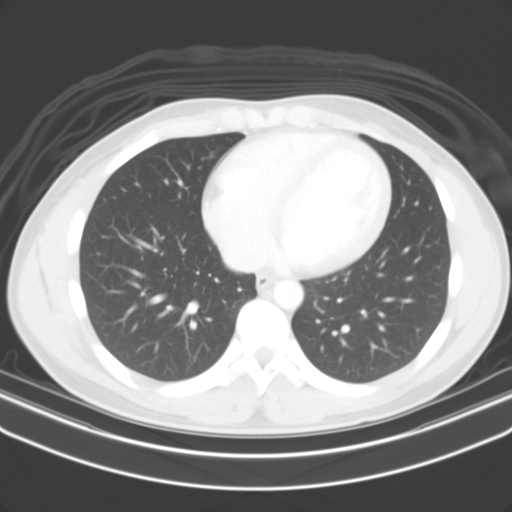
[im 58/61  soft-tissue]
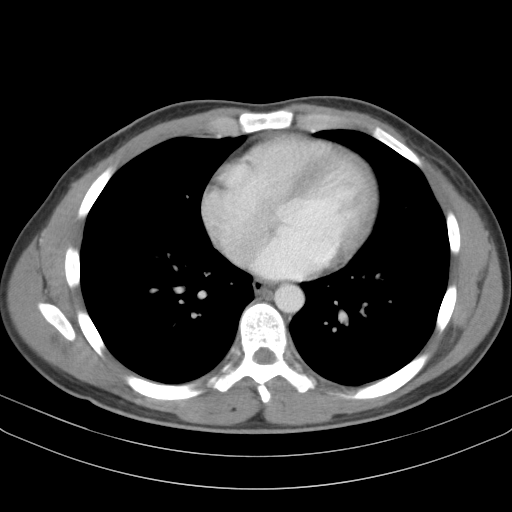
[im 58/61  lung]
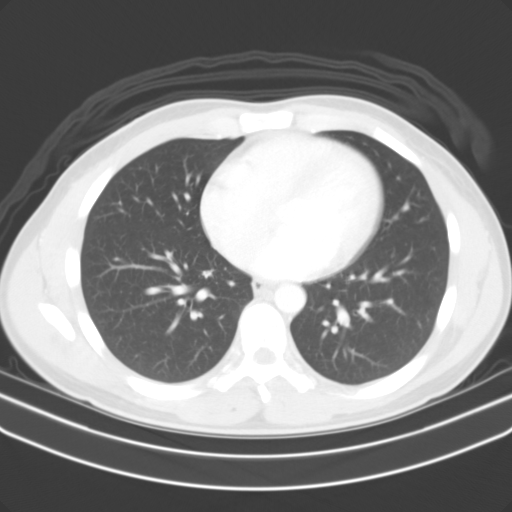

[15 of 32 positions shown; findings below may reference images not displayed]

FINDINGS: Lower chest: Lung bases show no acute findings. Heart size normal.
No pericardial or pleural effusion.

Hepatobiliary: A 1.8 cm low-attenuation lesion appears to arise from
the right hepatic lobe, superior to the gallbladder fossa (series 2,
image 27), likely representing a cyst. There is hepatic parenchymal
hyper attenuation along the gallbladder fossa. Gallbladder wall
appears edematous and there is at least 1 gallstone. Measurement is
difficult as it is not well calcified. No biliary ductal dilatation.

Pancreas: Negative.

Spleen: Negative.

Adrenals/Urinary Tract: Adrenal glands and kidneys are unremarkable.
Visualized portions of the ureters are decompressed.

Stomach/Bowel: Stomach and visualized portions of the small bowel
and colon are unremarkable.

Vascular/Lymphatic: Vascular structures are unremarkable. No
pathologically enlarged lymph nodes.

Other: Mesenteries and peritoneum are unremarkable.  No free fluid.

Musculoskeletal: No worrisome lytic or sclerotic lesions.
IMPRESSION: 1.
2. Cholelithiasis with gallbladder wall edema and apparent hyperemia
in the surrounding hepatic parenchyma. Findings are suggestive of
acute cholecystitis. Right upper quadrant ultrasound may be helpful
in further evaluation, as clinically indicated.
3. Probable small hepatic cyst along the superior margin of the
gallbladder fossa.
